# Patient Record
Sex: Male | Born: 1975 | Race: White | Hispanic: No | Marital: Married | Smoking: Former smoker
Health system: Southern US, Community
[De-identification: ages and names within clinical notes are randomized; demographics above are authoritative.]

## PROBLEM LIST (undated history)

## (undated) DIAGNOSIS — B029 Zoster without complications: Secondary | ICD-10-CM

## (undated) DIAGNOSIS — M79676 Pain in unspecified toe(s): Secondary | ICD-10-CM

## (undated) DIAGNOSIS — B019 Varicella without complication: Secondary | ICD-10-CM

## (undated) DIAGNOSIS — Z0000 Encounter for general adult medical examination without abnormal findings: Secondary | ICD-10-CM

## (undated) HISTORY — DX: Encounter for general adult medical examination without abnormal findings: Z00.00

## (undated) HISTORY — DX: Varicella without complication: B01.9

## (undated) HISTORY — DX: Pain in unspecified toe(s): M79.676

## (undated) HISTORY — DX: Zoster without complications: B02.9

## (undated) HISTORY — PX: WISDOM TOOTH EXTRACTION: SHX21

---

## 2010-08-29 ENCOUNTER — Emergency Department (HOSPITAL_COMMUNITY)
Admission: EM | Admit: 2010-08-29 | Discharge: 2010-08-29 | Payer: Self-pay | Source: Home / Self Care | Admitting: Emergency Medicine

## 2013-01-02 ENCOUNTER — Encounter: Payer: Self-pay | Admitting: Family Medicine

## 2013-01-02 ENCOUNTER — Ambulatory Visit (INDEPENDENT_AMBULATORY_CARE_PROVIDER_SITE_OTHER): Admitting: Family Medicine

## 2013-01-02 VITALS — BP 140/83 | HR 79 | Temp 97.9°F | Resp 16 | Ht 70.5 in | Wt 227.8 lb

## 2013-01-02 DIAGNOSIS — L239 Allergic contact dermatitis, unspecified cause: Secondary | ICD-10-CM | POA: Insufficient documentation

## 2013-01-02 DIAGNOSIS — L259 Unspecified contact dermatitis, unspecified cause: Secondary | ICD-10-CM

## 2013-01-02 MED ORDER — FLUTICASONE PROPIONATE 0.05 % EX CREA: TOPICAL_CREAM | CUTANEOUS | Status: DC

## 2013-01-02 MED ORDER — METHYLPREDNISOLONE ACETATE 40 MG/ML IJ SUSP
40.0000 mg | Freq: Once | INTRAMUSCULAR | Status: AC
  Administered 2013-01-02: 40 mg via INTRAMUSCULAR

## 2013-01-02 NOTE — Assessment & Plan Note (Signed)
Discussed plan: his rash at this point is not bad but it is slowly spreading. He relates a history of gradual progression once he gets this type of rxn, to the point that he has to have oral steroids to get it to resolve. We decided to give depo medrol 40mg  IM today in office and also rx'd generic cutivate 0.05% cream to apply to affected areas bid prn. Avoid other topical meds. May use OTC nonsedating antihistamine prn itching.

## 2013-01-02 NOTE — Progress Notes (Signed)
Office Note 01/02/2013  CC:  Chief Complaint  Patient presents with  . Establish Care    NP to establish; pt has rash of poison ivy/oak on Left forearm.    HPI:  Jerry Wilkerson is a 37 y.o. White male who is here to establish care and discuss rash. Patient's most recent primary MD: none Old records were not reviewed prior to or during today's visit.    Pt came in contact with poison ivy or oak about a week ago when doing some yard work.  A day or two later he noticed onse of itchy rash on top of left hand, then on left forearm. Most recently, a spot has come up on right side of neck.  Quite itchy.  Nothing has been applied to the rash and nothing has been taking PO to help itching or rash.  Past Medical History  Diagnosis Date  . Chicken pox   . Shingles     x2  Hx of allergic contact derm to poison ivy/oak x 2 in the distant past.  Past Surgical History  Procedure Laterality Date  . Wisdom tooth extraction      Family History  Problem Relation Age of Onset  . Breast cancer Mother 49    Deceased  . Heart attack Paternal Grandfather   . Heart attack Maternal Grandfather   . Alzheimer's disease Paternal Aunt   . Throat cancer Paternal Uncle   . Healthy Brother   . Healthy Father     History   Social History  . Marital Status: Married    Spouse Name: N/A    Number of Children: N/A  . Years of Education: N/A   Occupational History  . Not on file.   Social History Main Topics  . Smoking status: Former Games developer  . Smokeless tobacco: Not on file     Comment: Quit date >10 years  . Alcohol Use: Not on file  . Drug Use: Not on file  . Sexually Active: Not on file   Other Topics Concern  . Not on file   Social History Narrative   Married, 2 adopted sons and 2 biologic daughters.   Occupation: Therapist, music.   Education: MBA at [employer] in [place]   No tob/drugs.  Occ alcohol.   MEDS: none  No Known Allergies  ROS Review of Systems   Constitutional: Negative for fever and fatigue.  HENT: Negative for congestion and sore throat.   Eyes: Negative for visual disturbance.  Respiratory: Negative for cough.   Cardiovascular: Negative for chest pain.  Gastrointestinal: Negative for nausea and abdominal pain.  Genitourinary: Negative for dysuria.  Musculoskeletal: Negative for back pain and joint swelling.  Skin: Positive for rash (as per HPI).  Neurological: Negative for weakness and headaches.  Hematological: Negative for adenopathy.    PE; Blood pressure 140/83, pulse 79, temperature 97.9 F (36.6 C), temperature source Oral, resp. rate 16, height 5' 10.5" (1.791 m), weight 227 lb 12 oz (103.307 kg), SpO2 97.00%. Gen: Alert, well appearing.  Patient is oriented to person, place, time, and situation. AFFECT: pleasant, lucid thought and speech. CV: RRR, no m/r/g.   LUNGS: CTA bilat, nonlabored resps, good aeration in all lung fields. EXT: no clubbing, cyanosis, or edema.  SKIN: left forearm and top of hand with a few patches of mildly erythematous papules.  No vesicles or pustules. No erythema or streaking around these.  No tenderness.   Similar small patch on right inferior portion  of neck. Otherwise the skin is clear.  Pertinent labs:  None today  ASSESSMENT AND PLAN:   New pt; no old records to obtain  Allergic contact dermatitis Discussed plan: his rash at this point is not bad but it is slowly spreading. He relates a history of gradual progression once he gets this type of rxn, to the point that he has to have oral steroids to get it to resolve. We decided to give depo medrol 40mg  IM today in office and also rx'd generic cutivate 0.05% cream to apply to affected areas bid prn. Avoid other topical meds. May use OTC nonsedating antihistamine prn itching.  An After Visit Summary was printed and given to the patient.  Return if symptoms worsen or fail to improve.

## 2013-05-06 ENCOUNTER — Emergency Department (INDEPENDENT_AMBULATORY_CARE_PROVIDER_SITE_OTHER)
Admission: EM | Admit: 2013-05-06 | Discharge: 2013-05-06 | Disposition: A | Discharge status: Home / Self Care | Source: Home / Self Care | Attending: Family Medicine | Admitting: Family Medicine

## 2013-05-06 ENCOUNTER — Emergency Department (INDEPENDENT_AMBULATORY_CARE_PROVIDER_SITE_OTHER)

## 2013-05-06 ENCOUNTER — Encounter: Payer: Self-pay | Admitting: *Deleted

## 2013-05-06 DIAGNOSIS — R51 Headache: Secondary | ICD-10-CM

## 2013-05-06 DIAGNOSIS — M542 Cervicalgia: Secondary | ICD-10-CM

## 2013-05-06 DIAGNOSIS — S139XXA Sprain of joints and ligaments of unspecified parts of neck, initial encounter: Secondary | ICD-10-CM

## 2013-05-06 DIAGNOSIS — S161XXA Strain of muscle, fascia and tendon at neck level, initial encounter: Secondary | ICD-10-CM

## 2013-05-06 MED ORDER — CYCLOBENZAPRINE HCL 10 MG PO TABS: 10.0000 mg | ORAL_TABLET | Freq: Three times a day (TID) | ORAL | Status: DC | PRN

## 2013-05-06 MED ORDER — MELOXICAM 15 MG PO TABS: 15.0000 mg | ORAL_TABLET | Freq: Every day | ORAL | Status: DC
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
## 2013-05-09 ENCOUNTER — Telehealth: Payer: Self-pay

## 2013-05-09 NOTE — ED Notes (Signed)
I called and spoke with patient and he is doing better. I advised to call back if anything changes or if he has questions or concerns.  

## 2013-12-24 ENCOUNTER — Ambulatory Visit (INDEPENDENT_AMBULATORY_CARE_PROVIDER_SITE_OTHER): Admitting: Family Medicine

## 2013-12-24 ENCOUNTER — Encounter: Payer: Self-pay | Admitting: Family Medicine

## 2013-12-24 VITALS — BP 120/81 | HR 61 | Temp 98.6°F | Resp 16 | Ht 71.5 in | Wt 201.0 lb

## 2013-12-24 DIAGNOSIS — L239 Allergic contact dermatitis, unspecified cause: Secondary | ICD-10-CM

## 2013-12-24 DIAGNOSIS — L259 Unspecified contact dermatitis, unspecified cause: Secondary | ICD-10-CM

## 2013-12-24 MED ORDER — FLUTICASONE PROPIONATE 0.05 % EX CREA: TOPICAL_CREAM | CUTANEOUS | Status: DC

## 2013-12-24 MED ORDER — METHYLPREDNISOLONE ACETATE 80 MG/ML IJ SUSP
80.0000 mg | Freq: Once | INTRAMUSCULAR | Status: AC
  Administered 2013-12-24: 80 mg via INTRAMUSCULAR

## 2013-12-24 NOTE — Progress Notes (Signed)
Pre visit review using our clinic review tool, if applicable. No additional management support is needed unless otherwise documented below in the visit note. 

## 2013-12-24 NOTE — Assessment & Plan Note (Signed)
Depo medrol 80mg  IM today. Cutivate 0.05% cream bid prn to affected areas. Benadryl 25mg  q6h prn recommended.

## 2013-12-24 NOTE — Progress Notes (Signed)
OFFICE NOTE  12/24/2013  CC:  Chief Complaint  Patient presents with  . Rash    arm and back of legs   HPI: Patient is a 38 y.o. Caucasian male who is here for rash. Itchy rash on right forearm and back of both legs last few days after coming into contact with poison ivy.  Has hx of significant skin rash with exposure to poison ivy in the past--multiple times.  Pertinent PMH:  Past medical, surgical, social, and family history reviewed and no changes are noted since last office visit.  MEDS:  OTC hydrocortisone ointment and benadryl  PE: Blood pressure 120/81, pulse 61, temperature 98.6 F (37 C), temperature source Temporal, resp. rate 16, height 5' 11.5" (1.816 m), weight 201 lb (91.173 kg), SpO2 98.00%. Gen: Alert, well appearing.  Patient is oriented to person, place, time, and situation. SKIN: right forearm and backs of both legs (from mid hamstring level down to proximal calf level) with pinkish papular rash.  A few tiny vesicles are noted on right arm but none on legs.   IMPRESSION AND PLAN:  Allergic contact dermatitis Depo medrol 80mg  IM today. Cutivate 0.05% cream bid prn to affected areas. Benadryl 25mg  q6h prn recommended.   An After Visit Summary was printed and given to the patient.  FOLLOW UP: prn

## 2014-01-06 ENCOUNTER — Encounter: Payer: Self-pay | Admitting: Family Medicine

## 2014-01-06 ENCOUNTER — Ambulatory Visit (INDEPENDENT_AMBULATORY_CARE_PROVIDER_SITE_OTHER): Admitting: Family Medicine

## 2014-01-06 VITALS — BP 125/83 | HR 71 | Temp 98.6°F | Resp 18 | Ht 71.5 in | Wt 202.0 lb

## 2014-01-06 DIAGNOSIS — L259 Unspecified contact dermatitis, unspecified cause: Secondary | ICD-10-CM

## 2014-01-06 DIAGNOSIS — L239 Allergic contact dermatitis, unspecified cause: Secondary | ICD-10-CM

## 2014-01-06 MED ORDER — PREDNISONE 20 MG PO TABS: ORAL_TABLET | ORAL | Status: DC

## 2014-01-06 NOTE — Patient Instructions (Signed)
Take zyrtec 10mg  tab once daily until rash resolves.

## 2014-01-06 NOTE — Progress Notes (Signed)
Pre visit review using our clinic review tool, if applicable. No additional management support is needed unless otherwise documented below in the visit note. 

## 2014-01-06 NOTE — Progress Notes (Signed)
OFFICE NOTE  01/06/2014  CC:  Chief Complaint  Patient presents with  . Follow-up    HPI: Patient is a 38 y.o. Caucasian male who is here for rash.  I saw him 2 wks ago for allergic contact derm, gave 80mg  depo medrol x 1 and cutivate to apply topically.   Right arm rash resolved but backs of both legs still with itchy rash, also some new randomly placed splotches of rash that itch that come up every few days.  No more exposure to contact irritant (poison ivy).  Pertinent PMH:  Past surgical, social, and family history reviewed and no changes noted since last office visit.  MEDS:  Outpatient Prescriptions Prior to Visit  Medication Sig Dispense Refill  . fluticasone (CUTIVATE) 0.05 % cream Apply to affected areas twice daily as needed for poison ivy/poison oak rash  60 g  1   No facility-administered medications prior to visit.  Generic zyrtec at night sometimes.  PE: Blood pressure 125/83, pulse 71, temperature 98.6 F (37 C), temperature source Temporal, resp. rate 18, height 5' 11.5" (1.816 m), weight 202 lb (91.627 kg), SpO2 97.00%. Gen: Alert, well appearing.  Patient is oriented to person, place, time, and situation. SKIN: backs of thighs with large patches of pinkish papular rash that is coalesced, mildly hyperkeratotic. Similar splotch of rash on upper left arm, both hips, a few on his back. No swelling of lips, eyes, tongue.    IMPRESSION AND PLAN:  Allergic contact dermatitis: will do systemic steroid taper now: prednisone 40mg  qd x 7d, then 20mg  qd x 7d, then 10mg  qd x 8d, then stop. Take zyrtec 10mg  qd until rash resolves.  An After Visit Summary was printed and given to the patient.  FOLLOW UP: prn
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
## 2015-10-18 LAB — TSH: TSH: 2.17 u[IU]/mL (ref <=5.90)

## 2015-10-18 LAB — BASIC METABOLIC PANEL
BUN: 17 mg/dL (ref 4–21)
Creatinine: 0.9 mg/dL (ref <=1.3)
GLUCOSE: 90 mg/dL
Potassium: 5 mmol/L (ref 3.4–5.3)
SODIUM: 139 mmol/L (ref 137–147)

## 2015-10-18 LAB — LIPID PANEL
CHOLESTEROL: 191 mg/dL (ref 0–200)
HDL: 74 mg/dL — AB (ref 35–70)
LDL CALC: 102 mg/dL
Triglycerides: 68 mg/dL (ref 40–160)

## 2015-10-18 LAB — CBC AND DIFFERENTIAL
HEMATOCRIT: 43 % (ref 41–53)
HEMOGLOBIN: 14.6 g/dL (ref 13.5–17.5)
Platelets: 265 10*3/uL (ref 150–399)
WBC: 4.4 10*3/mL

## 2015-10-18 LAB — HEPATIC FUNCTION PANEL
ALT: 25 U/L (ref 10–40)
AST: 27 U/L (ref 14–40)
Alkaline Phosphatase: 63 U/L (ref 25–125)
Bilirubin, Total: 0.7 mg/dL

## 2015-12-19 ENCOUNTER — Telehealth: Payer: Self-pay | Admitting: Family Medicine

## 2015-12-19 NOTE — Telephone Encounter (Signed)
Pt's spouse Hanley BenCheryl Reimers is a current pt of Dr. Abner GreenspanBlyth and current PA student that is working with her. At her appt she spoke with Dr. B taking her husband on as a new patient. Per pt provider okay'd taking him on and scheduler scheduled np appt with provider.       Pt would like to switch providers from [place] to have same PCP as spouse.

## 2015-12-20 ENCOUNTER — Encounter: Payer: Self-pay | Admitting: Family Medicine

## 2015-12-20 ENCOUNTER — Ambulatory Visit (INDEPENDENT_AMBULATORY_CARE_PROVIDER_SITE_OTHER): Payer: BLUE CROSS/BLUE SHIELD | Admitting: Family Medicine

## 2015-12-20 VITALS — BP 112/78 | HR 64 | Temp 97.9°F | Ht 71.0 in | Wt 157.1 lb

## 2015-12-20 DIAGNOSIS — Z0000 Encounter for general adult medical examination without abnormal findings: Secondary | ICD-10-CM | POA: Insufficient documentation

## 2015-12-20 DIAGNOSIS — M79676 Pain in unspecified toe(s): Secondary | ICD-10-CM

## 2015-12-20 DIAGNOSIS — Z23 Encounter for immunization: Secondary | ICD-10-CM

## 2015-12-20 DIAGNOSIS — M79674 Pain in right toe(s): Secondary | ICD-10-CM

## 2015-12-20 HISTORY — DX: Encounter for general adult medical examination without abnormal findings: Z00.00

## 2015-12-20 HISTORY — DX: Pain in unspecified toe(s): M79.676

## 2015-12-20 NOTE — Assessment & Plan Note (Signed)
Patient encouraged to maintain heart healthy diet, regular exercise, adequate sleep. Consider daily probiotics. Take medications as prescribed. Given and reviewed copy of ACP documents from [place] Secretary of State and encouraged to complete and return 

## 2015-12-20 NOTE — Assessment & Plan Note (Signed)
No warmth, redness or swelling. Uric acid normal in work labs. Encouraged good orthotic and topical treatments. If no improvement will need referral to podiatry

## 2015-12-20 NOTE — Telephone Encounter (Signed)
OK with me.

## 2015-12-20 NOTE — Progress Notes (Signed)
Pre visit review using our clinic review tool, if applicable. No additional management support is needed unless otherwise documented below in the visit note. 

## 2015-12-20 NOTE — Progress Notes (Signed)
Subjective:    Patient ID: Jerry Wilkerson, male    DOB: 09/02/1975, 40 y.o.   MRN: [id number]  Chief Complaint  Patient presents with  . Annual Exam    New Patient    HPI Patient is in today for Annual Exam as new patient.  Patient currently works at the [employer]. PMH is unremarkable. Review of labs from work show mild elevation of cholesterol. Denies CP/palp/SOB/HA/congestion/fevers/GI or GU c/o. Taking meds as prescribed  Past Medical History  Diagnosis Date  . Chicken pox   . Shingles     x2    Past Surgical History  Procedure Laterality Date  . Wisdom tooth extraction      Family History  Problem Relation Age of Onset  . Breast cancer Mother 78    Deceased  . Heart attack Paternal Grandfather   . Heart attack Maternal Grandfather   . Alzheimer's disease Paternal Aunt   . Throat cancer Paternal Uncle   . Healthy Brother   . Healthy Father     Social History   Social History  . Marital Status: Married    Spouse Name: N/A  . Number of Children: N/A  . Years of Education: N/A   Occupational History  . [employer]    Social History Main Topics  . Smoking status: Former Research scientist (life sciences)  . Smokeless tobacco: Not on file     Comment: Quit date >10 years  . Alcohol Use: 0.0 oz/week    0 Standard drinks or equivalent per week  . Drug Use: No  . Sexual Activity: Not on file   Other Topics Concern  . Not on file   Social History Narrative   Married, 2 adopted sons and 2 biologic daughters.   Occupation: Surveyor, minerals.   Education: MBA at [employer] in [employer]   No tob/drugs.  Occ alcohol.    Outpatient Prescriptions Prior to Visit  Medication Sig Dispense Refill  . fluticasone (CUTIVATE) 0.05 % cream Apply to affected areas twice daily as needed for poison ivy/poison oak rash 60 g 1  . predniSONE (DELTASONE) 20 MG tablet 2 tabs po qd x 7d, then 1 tab po qd x 7d, then 1/2 tab po qd x 8d, then stop 25 tablet 0   No facility-administered  medications prior to visit.    No Known Allergies  Review of Systems  Constitutional: Negative for fever and malaise/fatigue.  HENT: Negative for congestion.   Eyes: Negative for blurred vision.  Respiratory: Negative for shortness of breath.   Cardiovascular: Negative for chest pain, palpitations and leg swelling.  Gastrointestinal: Negative for nausea, abdominal pain and blood in stool.  Genitourinary: Negative for dysuria and frequency.  Musculoskeletal: Positive for joint pain. Negative for falls.  Skin: Negative for rash.  Neurological: Negative for dizziness, loss of consciousness and headaches.  Endo/Heme/Allergies: Negative for environmental allergies.  Psychiatric/Behavioral: Negative for depression. The patient is not nervous/anxious.        Objective:    Physical Exam  Constitutional: He is oriented to person, place, and time. He appears well-developed and well-nourished. No distress.  HENT:  Head: Normocephalic and atraumatic.  Eyes: Conjunctivae are normal.  Neck: Neck supple. No thyromegaly present.  Cardiovascular: Normal rate, regular rhythm and normal heart sounds.   No murmur heard. Pulmonary/Chest: Effort normal and breath sounds normal. No respiratory distress. He has no wheezes.  Abdominal: Soft. Bowel sounds are normal. He exhibits no mass. There is no tenderness.  Musculoskeletal:  He exhibits no edema.  Lymphadenopathy:    He has no cervical adenopathy.  Neurological: He is alert and oriented to person, place, and time.  Skin: Skin is warm and dry.  Psychiatric: He has a normal mood and affect. His behavior is normal.    BP 112/78 mmHg  Pulse 64  Temp(Src) 97.9 F (36.6 C) (Oral)  Ht 5' 11"  (1.803 m)  Wt 157 lb 2 oz (71.271 kg)  BMI 21.92 kg/m2  SpO2 98% Wt Readings from Last 3 Encounters:  12/20/15 157 lb 2 oz (71.271 kg)  01/06/14 202 lb (91.627 kg)  12/24/13 201 lb (91.173 kg)     No results found for: WBC, HGB, HCT, PLT, GLUCOSE,  CHOL, TRIG, HDL, LDLDIRECT, LDLCALC, ALT, AST, NA, K, CL, CREATININE, BUN, CO2, TSH, PSA, INR, GLUF, HGBA1C, MICROALBUR  No results found for: TSH No results found for: WBC, HGB, HCT, MCV, PLT No results found for: NA, K, CHLORIDE, CO2, GLUCOSE, BUN, CREATININE, BILITOT, ALKPHOS, AST, ALT, PROT, ALBUMIN, CALCIUM, ANIONGAP, EGFR, GFR No results found for: CHOL No results found for: HDL No results found for: LDLCALC No results found for: TRIG No results found for: CHOLHDL No results found for: HGBA1C     Assessment & Plan:   Problem List Items Addressed This Visit    None      I have discontinued Mr. Smeltzer fluticasone and predniSONE.  No orders of the defined types were placed in this encounter.     Jan Fireman, [place]

## 2015-12-20 NOTE — Patient Instructions (Signed)

## 2019-10-13 ENCOUNTER — Encounter: Payer: Self-pay | Admitting: Family Medicine
